# Patient Record
Sex: Male | Born: 2000 | Race: Black or African American | Hispanic: No | Marital: Single | Smoking: Never smoker
Health system: Southern US, Community
[De-identification: ages and names within clinical notes are randomized; demographics above are authoritative.]

## PROBLEM LIST (undated history)

## (undated) DIAGNOSIS — J45909 Unspecified asthma, uncomplicated: Secondary | ICD-10-CM

## (undated) HISTORY — DX: Unspecified asthma, uncomplicated: J45.909

---
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
## 2019-03-06 ENCOUNTER — Other Ambulatory Visit: Payer: Self-pay

## 2019-03-06 ENCOUNTER — Encounter (HOSPITAL_COMMUNITY): Payer: Self-pay

## 2019-03-06 ENCOUNTER — Ambulatory Visit (HOSPITAL_COMMUNITY)
Admission: EM | Admit: 2019-03-06 | Discharge: 2019-03-06 | Disposition: A | Discharge status: Home / Self Care | Payer: Medicaid Other | Attending: Family Medicine | Admitting: Family Medicine

## 2019-03-06 DIAGNOSIS — J020 Streptococcal pharyngitis: Secondary | ICD-10-CM

## 2019-03-06 LAB — POCT RAPID STREP A: Streptococcus, Group A Screen (Direct): POSITIVE — AB

## 2019-03-06 MED ORDER — AMOXICILLIN 875 MG PO TABS: 875.0000 mg | ORAL_TABLET | Freq: Two times a day (BID) | ORAL | 0 refills | Status: AC

## 2019-03-06 NOTE — ED Provider Notes (Signed)
[name]    CSN: [id number] Arrival date & time: 03/06/19  2542      History   Chief Complaint Chief Complaint  Patient presents with  . Sore Throat    HPI Miguel Byrd is a 18 y.o. male.   HPI  Patient's had a sore throat and fever for 2 days.  No known strep exposure.  No known COVID exposure.  He has a mild headache.  No nausea vomiting.  No abdominal pain.  No coughing or chest pain.  No shortness of breath.  Past Medical History:  Diagnosis Date  . Asthma     There are no active problems to display for this patient.   History reviewed. No pertinent surgical history.     Home Medications    Prior to Admission medications   Medication Sig Start Date End Date Taking? Authorizing Provider  amoxicillin (AMOXIL) 875 MG tablet Take 1 tablet (875 mg total) by mouth 2 (two) times daily for 10 days. 03/06/19 03/16/19  Raylene Everts, MD    Family History Family History  Problem Relation Age of Onset  . Asthma Other     Social History Social History   Tobacco Use  . Smoking status: Never Smoker  . Smokeless tobacco: Never Used  Substance Use Topics  . Alcohol use: No    Frequency: Never  . Drug use: No     Allergies   Patient has no known allergies.   Review of Systems Review of Systems  Constitutional: Positive for fever. Negative for chills.  HENT: Positive for sore throat and trouble swallowing. Negative for ear pain.   Eyes: Negative for pain and visual disturbance.  Respiratory: Negative for cough and shortness of breath.   Cardiovascular: Negative for chest pain and palpitations.  Gastrointestinal: Negative for abdominal pain and vomiting.  Genitourinary: Negative for dysuria and hematuria.  Musculoskeletal: Negative for arthralgias and back pain.  Skin: Negative for color change and rash.  Neurological: Positive for headaches. Negative for seizures and syncope.  All other systems reviewed and are negative.    Physical  Exam Triage Vital Signs ED Triage Vitals  Enc Vitals Group     BP 03/06/19 0823 117/82     Pulse Rate 03/06/19 0823 100     Resp 03/06/19 0823 17     Temp 03/06/19 0823 99.1 F (37.3 C)     Temp Source 03/06/19 0823 Oral     SpO2 03/06/19 0823 99 %     Weight --      Height --      Head Circumference --      Peak Flow --      Pain Score 03/06/19 0824 9     Pain Loc --      Pain Edu? --      Excl. in [place]? --    No data found.  Updated Vital Signs BP 117/82 (BP Location: Right Arm)   Pulse 100   Temp 99.1 F (37.3 C) (Oral)   Resp 17   SpO2 99%    Physical Exam Constitutional:      General: He is not in acute distress.    Appearance: He is well-developed and normal weight.  HENT:     Head: Normocephalic and atraumatic.     Ears:     Comments: Cerumen both ears    Nose: No congestion.     Mouth/Throat:     Mouth: Mucous membranes are moist.  Pharynx: Uvula midline. Posterior oropharyngeal erythema present.     Tonsils: No tonsillar exudate or tonsillar abscesses. 3+ on the right. 3+ on the left.  Eyes:     Conjunctiva/sclera: Conjunctivae normal.     Pupils: Pupils are equal, round, and reactive to light.  Neck:     Musculoskeletal: Normal range of motion.  Cardiovascular:     Rate and Rhythm: Normal rate.  Pulmonary:     Effort: Pulmonary effort is normal. No respiratory distress.  Abdominal:     General: There is no distension.     Palpations: Abdomen is soft.  Musculoskeletal: Normal range of motion.  Skin:    General: Skin is warm and dry.  Neurological:     General: No focal deficit present.     Mental Status: He is alert.  Psychiatric:        Mood and Affect: Mood normal.        Behavior: Behavior normal.      UC Treatments / Results  Labs (all labs ordered are listed, but only abnormal results are displayed) Labs Reviewed  POCT RAPID STREP A - Abnormal; Notable for the following components:      Result Value   Streptococcus, Group A  Screen (Direct) POSITIVE (*)    All other components within normal limits    EKG   Radiology No results found.  Procedures Procedures (including critical care time)  Medications Ordered in UC Medications - No data to display  Initial Impression / Assessment and Plan / UC Course  I have reviewed the triage vital signs and the nursing notes.  Pertinent labs & imaging results that were available during my care of the patient were reviewed by me and considered in my medical decision making (see chart for details).     Strep positive Final Clinical Impressions(s) / UC Diagnoses   Final diagnoses:  Strep throat     Discharge Instructions     It is important that you take 10 days of antibiotics Tylenol or ibuprofen for pain or fever May use salt water gargles or get chloraseptic spray for throat pain Call for problems   ED Prescriptions    Medication Sig Dispense Auth. Provider   amoxicillin (AMOXIL) 875 MG tablet Take 1 tablet (875 mg total) by mouth 2 (two) times daily for 10 days. 20 tablet Eustace MooreNelson, Quindarrius Joplin Sue, MD     Controlled Substance Prescriptions [place] Controlled Substance Registry consulted? no   Eustace MooreNelson, Melesio Madara Sue, MD 03/06/19 1731

## 2019-03-06 NOTE — Discharge Instructions (Signed)
It is important that you take 10 days of antibiotics Tylenol or ibuprofen for pain or fever May use salt water gargles or get chloraseptic spray for throat pain Call for problems

## 2019-03-06 NOTE — ED Triage Notes (Signed)
Pt presents with sore throat X 2 days. 

## 2020-12-01 ENCOUNTER — Other Ambulatory Visit: Payer: Self-pay

## 2020-12-01 ENCOUNTER — Ambulatory Visit (INDEPENDENT_AMBULATORY_CARE_PROVIDER_SITE_OTHER): Payer: Medicaid Other

## 2020-12-01 ENCOUNTER — Ambulatory Visit (HOSPITAL_COMMUNITY)
Admission: EM | Admit: 2020-12-01 | Discharge: 2020-12-01 | Disposition: A | Discharge status: Home / Self Care | Payer: Medicaid Other | Attending: Medical Oncology | Admitting: Medical Oncology

## 2020-12-01 ENCOUNTER — Encounter (HOSPITAL_COMMUNITY): Payer: Self-pay

## 2020-12-01 DIAGNOSIS — Z20822 Contact with and (suspected) exposure to covid-19: Secondary | ICD-10-CM | POA: Insufficient documentation

## 2020-12-01 DIAGNOSIS — R079 Chest pain, unspecified: Secondary | ICD-10-CM

## 2020-12-01 DIAGNOSIS — R069 Unspecified abnormalities of breathing: Secondary | ICD-10-CM

## 2020-12-01 DIAGNOSIS — R059 Cough, unspecified: Secondary | ICD-10-CM | POA: Diagnosis not present

## 2020-12-01 DIAGNOSIS — R0602 Shortness of breath: Secondary | ICD-10-CM

## 2020-12-01 DIAGNOSIS — K449 Diaphragmatic hernia without obstruction or gangrene: Secondary | ICD-10-CM

## 2020-12-01 DIAGNOSIS — J45909 Unspecified asthma, uncomplicated: Secondary | ICD-10-CM | POA: Diagnosis not present

## 2020-12-01 DIAGNOSIS — R071 Chest pain on breathing: Secondary | ICD-10-CM | POA: Insufficient documentation

## 2020-12-01 LAB — SARS CORONAVIRUS 2 (TAT 6-24 HRS): SARS Coronavirus 2: NEGATIVE

## 2020-12-01 MED ORDER — ALBUTEROL SULFATE HFA 108 (90 BASE) MCG/ACT IN AERS: 1.0000 | INHALATION_SPRAY | Freq: Four times a day (QID) | RESPIRATORY_TRACT | 0 refills | Status: AC | PRN

## 2020-12-01 MED ORDER — BENZONATATE 100 MG PO CAPS: 100.0000 mg | ORAL_CAPSULE | Freq: Three times a day (TID) | ORAL | 0 refills | Status: DC

## 2020-12-01 NOTE — ED Provider Notes (Signed)
[facility]    CSN: [id number] Arrival date & time: 12/01/20  2595      History   Chief Complaint SOB  HPI Miguel Byrd is a 20 y.o. male.   HPI   SOB: Pt with a history of asthma that is mild and very intermittent presents with a 1 day history of SOB and chest pain. As of this morning as cough has started. He reports that in the past he has had similar symptoms due to a hiatal hernia and asks for a chest x ray today. He denies worsening symptoms with food or activity. In terms of the SOB and chest pain he states that symptoms have greatly improved and have essentially resolved as of this morning. Chest pain described as sternal in nature and worse with deep inspiration. No accompanied nausea, dizziness, fever, syncope. He has not tried anything for symptoms.    Past Medical History:  Diagnosis Date  . Asthma     There are no problems to display for this patient.   History reviewed. No pertinent surgical history.     Home Medications    Prior to Admission medications   Medication Sig Start Date End Date Taking? Authorizing Provider  albuterol (VENTOLIN HFA) 108 (90 Base) MCG/ACT inhaler Inhale 1-2 puffs into the lungs every 6 (six) hours as needed for wheezing or shortness of breath. 12/01/20  Yes Rushie Chestnut, PA-C    Family History Family History  Problem Relation Age of Onset  . Asthma Other     Social History Social History   Tobacco Use  . Smoking status: Never Smoker  . Smokeless tobacco: Never Used  Vaping Use  . Vaping Use: Never used  Substance Use Topics  . Alcohol use: No  . Drug use: No     Allergies   Patient has no known allergies.   Review of Systems Review of Systems  As stated above in HPI Physical Exam Triage Vital Signs ED Triage Vitals  Enc Vitals Group     BP 12/01/20 0834 117/74     Pulse Rate 12/01/20 0834 76     Resp 12/01/20 0834 17     Temp 12/01/20 0834 99.2 F (37.3 C)     Temp Source 12/01/20  0834 Oral     SpO2 12/01/20 0834 99 %     Weight --      Height --      Head Circumference --      Peak Flow --      Pain Score 12/01/20 0830 8     Pain Loc --      Pain Edu? --      Excl. in [place]? --    No data found.  Updated Vital Signs BP 117/74 (BP Location: Left Arm)   Pulse 76   Temp 99.2 F (37.3 C) (Oral)   Resp 17   SpO2 99%   Physical Exam Vitals and nursing note reviewed.  Constitutional:      General: He is not in acute distress.    Appearance: Normal appearance. He is not ill-appearing, toxic-appearing or diaphoretic.  HENT:     Head: Normocephalic and atraumatic.     Right Ear: Tympanic membrane, ear canal and external ear normal.     Left Ear: Tympanic membrane, ear canal and external ear normal.     Nose: Nose normal.     Mouth/Throat:     Mouth: Mucous membranes are moist.     Pharynx: Oropharynx  is clear. No oropharyngeal exudate or posterior oropharyngeal erythema.  Eyes:     Extraocular Movements: Extraocular movements intact.     Pupils: Pupils are equal, round, and reactive to light.     Comments: No pallor   Cardiovascular:     Rate and Rhythm: Normal rate and regular rhythm.     Pulses: Normal pulses.     Heart sounds: Normal heart sounds.  Pulmonary:     Effort: Pulmonary effort is normal. No respiratory distress.     Breath sounds: Normal breath sounds. No stridor. No wheezing, rhonchi or rales.  Chest:     Chest wall: No tenderness.  Musculoskeletal:     Cervical back: Normal range of motion and neck supple.  Lymphadenopathy:     Cervical: No cervical adenopathy.  Skin:    General: Skin is warm.     Capillary Refill: Capillary refill takes less than 2 seconds.     Coloration: Skin is not pale.  Neurological:     Mental Status: He is alert and oriented to person, place, and time.      UC Treatments / Results  Labs (all labs ordered are listed, but only abnormal results are displayed) Labs Reviewed  SARS CORONAVIRUS 2 (TAT 6-24  HRS)    EKG   Radiology No results found.  Procedures Procedures (including critical care time)  Medications Ordered in UC Medications - No data to display  Initial Impression / Assessment and Plan / UC Course  I have reviewed the triage vital signs and the nursing notes.  Pertinent labs & imaging results that were available during my care of the patient were reviewed by me and considered in my medical decision making (see chart for details).     New. Vitals reassuring. No active chest pain or SOB to warrant EKG at this time. Will obtain a chest x ray given his 99.46F temp and history of hiatal hernia. Given his history will send in albuterol. Discussed negative x rays   Final Clinical Impressions(s) / UC Diagnoses   Final diagnoses:  SOB (shortness of breath)  Cough   Discharge Instructions   None    ED Prescriptions    Medication Sig Dispense Auth. Provider   albuterol (VENTOLIN HFA) 108 (90 Base) MCG/ACT inhaler Inhale 1-2 puffs into the lungs every 6 (six) hours as needed for wheezing or shortness of breath. 18 g Calah Gershman M, [place]     PDMP not reviewed this encounter.   Rushie Chestnut, [place] 12/01/20 [phone]

## 2020-12-01 NOTE — ED Notes (Signed)
Pt declined COVID testing.

## 2020-12-01 NOTE — ED Triage Notes (Signed)
Pt c/o trouble breathing and chest pain X last night. Pt states he started coughing this morning.

## 2021-08-11 ENCOUNTER — Other Ambulatory Visit: Payer: Self-pay

## 2021-08-11 ENCOUNTER — Encounter (HOSPITAL_COMMUNITY): Payer: Self-pay | Admitting: Emergency Medicine

## 2021-08-11 ENCOUNTER — Emergency Department (HOSPITAL_COMMUNITY)
Admission: EM | Admit: 2021-08-11 | Discharge: 2021-08-12 | Disposition: A | Discharge status: Home / Self Care | Payer: Medicaid Other | Attending: Emergency Medicine | Admitting: Emergency Medicine

## 2021-08-11 DIAGNOSIS — W268XXA Contact with other sharp object(s), not elsewhere classified, initial encounter: Secondary | ICD-10-CM | POA: Diagnosis not present

## 2021-08-11 DIAGNOSIS — Z23 Encounter for immunization: Secondary | ICD-10-CM | POA: Diagnosis not present

## 2021-08-11 DIAGNOSIS — Z046 Encounter for general psychiatric examination, requested by authority: Secondary | ICD-10-CM | POA: Diagnosis present

## 2021-08-11 DIAGNOSIS — R45851 Suicidal ideations: Secondary | ICD-10-CM | POA: Insufficient documentation

## 2021-08-11 DIAGNOSIS — F4329 Adjustment disorder with other symptoms: Secondary | ICD-10-CM | POA: Diagnosis not present

## 2021-08-11 DIAGNOSIS — J45909 Unspecified asthma, uncomplicated: Secondary | ICD-10-CM | POA: Insufficient documentation

## 2021-08-11 DIAGNOSIS — S61511A Laceration without foreign body of right wrist, initial encounter: Secondary | ICD-10-CM | POA: Diagnosis not present

## 2021-08-11 DIAGNOSIS — Z20822 Contact with and (suspected) exposure to covid-19: Secondary | ICD-10-CM | POA: Insufficient documentation

## 2021-08-11 LAB — SALICYLATE LEVEL: Salicylate Lvl: <7 mg/dL — ABNORMAL LOW (ref 7.0–30.0)

## 2021-08-11 LAB — CBC WITH DIFFERENTIAL/PLATELET
Abs Immature Granulocytes: 0.01 10*3/uL (ref 0.00–0.07)
Basophils Absolute: 0 10*3/uL (ref 0.0–0.1)
Basophils Relative: 1 %
Eosinophils Absolute: 0.1 10*3/uL (ref 0.0–0.5)
Eosinophils Relative: 1 %
HCT: 46.8 % (ref 39.0–52.0)
Hemoglobin: 15.9 g/dL (ref 13.0–17.0)
Immature Granulocytes: 0 %
Lymphocytes Relative: 39 %
Lymphs Abs: 2.4 10*3/uL (ref 0.7–4.0)
MCH: 31.7 pg (ref 26.0–34.0)
MCHC: 34 g/dL (ref 30.0–36.0)
MCV: 93.2 fL (ref 80.0–100.0)
Monocytes Absolute: 0.5 10*3/uL (ref 0.1–1.0)
Monocytes Relative: 8 %
Neutro Abs: 3.3 10*3/uL (ref 1.7–7.7)
Neutrophils Relative %: 51 %
Platelets: 209 10*3/uL (ref 150–400)
RBC: 5.02 MIL/uL (ref 4.22–5.81)
RDW: 12.6 % (ref 11.5–15.5)
WBC: 6.3 10*3/uL (ref 4.0–10.5)
nRBC: 0 % (ref 0.0–0.2)

## 2021-08-11 LAB — RESP PANEL BY RT-PCR (FLU A&B, COVID) ARPGX2
Influenza A by PCR: NEGATIVE
Influenza B by PCR: NEGATIVE
SARS Coronavirus 2 by RT PCR: NEGATIVE

## 2021-08-11 LAB — COMPREHENSIVE METABOLIC PANEL
ALT: 10 U/L (ref 0–44)
AST: 27 U/L (ref 15–41)
Albumin: 4.6 g/dL (ref 3.5–5.0)
Alkaline Phosphatase: 48 U/L (ref 38–126)
Anion gap: 7 (ref 5–15)
BUN: 11 mg/dL (ref 6–20)
CO2: 27 mmol/L (ref 22–32)
Calcium: 9.4 mg/dL (ref 8.9–10.3)
Chloride: 106 mmol/L (ref 98–111)
Creatinine, Ser: 1.23 mg/dL (ref 0.61–1.24)
GFR, Estimated: >60 mL/min (ref >60)
Glucose, Bld: 88 mg/dL (ref 70–99)
Potassium: 4 mmol/L (ref 3.5–5.1)
Sodium: 140 mmol/L (ref 135–145)
Total Bilirubin: 1.6 mg/dL — ABNORMAL HIGH (ref 0.3–1.2)
Total Protein: 7.4 g/dL (ref 6.5–8.1)

## 2021-08-11 LAB — ACETAMINOPHEN LEVEL: Acetaminophen (Tylenol), Serum: <10 ug/mL — ABNORMAL LOW (ref 10–30)

## 2021-08-11 LAB — ETHANOL: Alcohol, Ethyl (B): <10 mg/dL (ref <10)

## 2021-08-11 MED ORDER — TETANUS-DIPHTH-ACELL PERTUSSIS 5-2.5-18.5 LF-MCG/0.5 IM SUSY: 0.5000 mL | PREFILLED_SYRINGE | Freq: Once | INTRAMUSCULAR | Status: DC

## 2021-08-11 MED ORDER — ALBUTEROL SULFATE HFA 108 (90 BASE) MCG/ACT IN AERS: 1.0000 | INHALATION_SPRAY | Freq: Four times a day (QID) | RESPIRATORY_TRACT | Status: DC | PRN

## 2021-08-11 NOTE — ED Provider Notes (Signed)
Emergency Medicine Provider Triage Evaluation Note  Messi Twedt , a 20 y.o. male  was evaluated in triage.  Pt complains of suicidal ideation with suicidal attempt today.  Reports he has had suicidal ideation x2 months.  Denies homicidal ideation, audiovisual hallucinations.  Review of Systems  Positive: Suicidal ideation Negative: HI, AVH, chest pain, fever, shortness of breath  Physical Exam  BP 122/83 (BP Location: Left Arm)   Pulse (!) 102   Temp 98.3 F (36.8 C) (Oral)   Resp 18   SpO2 100%  Gen:   Awake, no distress   Resp:  Normal effort  MSK:   Moves extremities without difficulty  Other:    Medical Decision Making  Medically screening exam initiated at 10:36 PM.  Appropriate orders placed.  Jacorie Ernsberger was informed that the remainder of the evaluation will be completed by another provider, this initial triage assessment does not replace that evaluation, and the importance of remaining in the ED until their evaluation is complete.     Marita [place], PA-C 08/11/21 2236    Benjiman Core, MD 08/11/21 2251

## 2021-08-11 NOTE — BH Assessment (Signed)
Clinician made contact with pt's team at 2354 in regards to completing pt's MH Assessment. Clinician did not receive a response regarding the machine being moved by 0053 so she requested the team let her know at a later time if pt is ready to be seen.

## 2021-08-11 NOTE — ED Triage Notes (Signed)
Pt states that he tried to kill himself by cutting his right wrist. Pt reports being stressed out x 2 months. There are 2 small marks to pt's right wrist.
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
## 2021-08-12 DIAGNOSIS — F4329 Adjustment disorder with other symptoms: Secondary | ICD-10-CM

## 2021-08-12 NOTE — BH Assessment (Addendum)
Comprehensive Clinical Assessment (CCA) Note  08/12/2021 Miguel Byrd [id number] Disposition: Clinician discussed patient care with Lindon Romp, FNP.  He recommended patient be observed overnight in the ED.  There are no beds available at [facility] at this time for observation.  Pt to be seen by psychiatry on 12/09 during the day.  Clinician informed Abigail Butts, PA of disposition via secure messaging.  [place] ED from 08/11/2021 in [facility] ED from 12/01/2020 in [facility] [place] High Risk No Risk      The patient demonstrates the following risk factors for suicide: Chronic risk factors for suicide include: psychiatric disorder of MDD recurrent, severe and substance use disorder. Acute risk factors for suicide include: unemployment and social withdrawal/isolation. Protective factors for this patient include: positive social support. Considering these factors, the overall suicide risk at this point appears to be high. Patient is not appropriate for outpatient follow up.  Pt is oriented and has fair eye contact.  Pt is not responding to internal stimuli.  He does not evidence any delusional thought content.  He is clear and coherent with expressing himself.  Pt reports a poor appetite and only getting about 4-5 hours of sleep a night.    Pt has no current outpatient services.  Chief Complaint:  Chief Complaint  Patient presents with   Suicidal   Visit Diagnosis: MDD recurrent, severe; Cannabis use d/o severe    CCA Screening, Triage and Referral (STR)  Patient Reported Information How did you hear about [place]? Family/Friend (Mother brought him to [facility].)  What Is the Reason for Your Visit/Call Today? Pt lives with his cousin.  Yesterday (12/08) he went to his grandmotehr's home and called his mother and told her he was having some suicidal thoughts for the last two months.  Pt mother brought him to  hospital.  Pt says he feels very overwhelmed.  "I try to reach out for help but it is difficult for me to open up."  He says his stressor is "not knowing what to do with my life."  Patient has been having plan to cut his wrists since two days ago.  Pt did make some cuts to his right wrist with a pocket knife on Wednesday (12/07).  Pt has had no previous attempt to kill himself.  Patient denies any HI or A/V hallucinations.  Pt admits to using marijuana from a bong daily, usually about a gram.  Pt has been using daily since November '21.  Pt denies any access to weapons.  Pt sleeps about 4-5 hours a night.  Reports a poor appetite.  Will use the marijuana to boost his appetite.  Pt not receiving any services now.  Pt does not feel safe by himself.  How Long Has This Been Causing You Problems? 1-6 months  What Do You Feel Would Help You the Most Today? Treatment for Depression or other mood problem   Have You Recently Had Any Thoughts About Hurting Yourself? Yes  Are You Planning to Commit Suicide/Harm Yourself At This time? Yes   Have you Recently Had Thoughts About Hurting Someone [name]? No  Are You Planning to Harm Someone at This Time? No  Explanation: No data recorded  Have You Used Any Alcohol or Drugs in the Past 24 Hours? Yes  How Long Ago Did You Use Drugs or Alcohol? No data recorded What Did You Use and How Much? About less than a gram of  marijuana yesteray 912/08).   Do You Currently Have a Therapist/Psychiatrist? No  Name of Therapist/Psychiatrist: No data recorded  Have You Been Recently Discharged From Any Office Practice or Programs? No  Explanation of Discharge From Practice/Program: No data recorded    CCA Screening Triage Referral Assessment Type of Contact: Tele-Assessment  Telemedicine Service Delivery:   Is this Initial or Reassessment? Initial Assessment  Date Telepsych consult ordered in [facility]:  08/11/21  Time Telepsych consult ordered in [facility]:   2316  Location of Assessment: [facility] ED  Provider Location: [facility] Assessment Services   Collateral Involvement: No data recorded  Does Patient Have a [place]? No data recorded Name and Contact of Legal Guardian: No data recorded If Minor and Not Living with Parent(s), Who has Custody? No data recorded Is CPS involved or ever been involved? Never  Is APS involved or ever been involved? Never   Patient Determined To Be At Risk for Harm To Self or Others Based on Review of Patient Reported Information or Presenting Complaint? Yes, for Self-Harm  Method: No data recorded Availability of Means: No data recorded Intent: No data recorded Notification Required: No data recorded Additional Information for Danger to Others Potential: No data recorded Additional Comments for Danger to Others Potential: No data recorded Are There Guns or Other Weapons in Your Home? No data recorded Types of Guns/Weapons: No data recorded Are These Weapons Safely Secured?                            No data recorded Who Could Verify You Are Able To Have These Secured: No data recorded Do You Have any Outstanding Charges, Pending Court Dates, Parole/Probation? No data recorded Contacted To Inform of Risk of Harm To Self or Others: No data recorded   Does Patient Present under Involuntary Commitment? No  IVC Papers Initial File Date: No data recorded  [place] of Residence: [place]   Patient Currently Receiving the Following Services: Not Receiving Services   Determination of Need: Urgent (48 hours)   Options For Referral: Other: Comment (Pt to be observed overnight in ED.  No bed space available at [facility].)     [place] Patient Reported Schizophrenia/Schizoaffective Diagnosis in Past: No   Strengths: Pt likes to sing and make music   Mental Health Symptoms Depression:   Difficulty Concentrating; Sleep (too much or little); Increase/decrease in appetite;  Hopelessness; Worthlessness; Fatigue   Duration of Depressive symptoms:  Duration of Depressive Symptoms: Greater than two weeks   Mania:   None   Anxiety:    Difficulty concentrating; Tension; Worrying; Irritability   Psychosis:   None   Duration of Psychotic symptoms:    Trauma:   None   Obsessions:   Cause anxiety   Compulsions:   Disrupts with routine/functioning   Inattention:   None   Hyperactivity/Impulsivity:   None   Oppositional/Defiant Behaviors:   None   Emotional Irregularity:   None   Other Mood/Personality Symptoms:  No data recorded   Mental Status Exam Appearance and self-care  Stature:   Average   Weight:   Thin   Clothing:   Casual   Grooming:   Normal   Cosmetic use:   None   Posture/gait:   Normal   Motor activity:   Not Remarkable   Sensorium  Attention:   Normal   Concentration:   Anxiety interferes   Orientation:   X5  Recall/memory:   Normal   Affect and Mood  Affect:   Anxious; Depressed   Mood:   Anxious; Depressed   Relating  Eye contact:   Normal   Facial expression:   Depressed; Anxious   Attitude toward examiner:   Cooperative   Thought and Language  Speech flow:  Clear and Coherent   Thought content:   Appropriate to Mood and Circumstances   Preoccupation:   None   Hallucinations:   None   Organization:  No data recorded  [employer] of Knowledge:   Average   Intelligence:   Average   Abstraction:   Normal   Judgement:   Poor   Reality Testing:   Realistic   Insight:   Fair   Decision Making:   Impulsive   Social Functioning  Social Maturity:   Impulsive   Social Judgement:   Normal   Stress  Stressors:   Relationship   Coping Ability:   Normal   Skill Deficits:   Interpersonal   Supports:   Family; Support needed     Religion:    Leisure/Recreation:    Exercise/Diet: Exercise/Diet Have You Gained or Lost A  Significant Amount of Weight in the Past Six Months?: No Do You Have Any Trouble Sleeping?: Yes Explanation of Sleeping Difficulties: Getting 4-5 hours.   CCA Employment/Education Employment/Work Situation: Employment / Work Situation Employment Situation: Unemployed Patient's Job has Been Impacted by Current Illness: No Has Patient ever Been in Passenger transport manager?: No  Education: Education Is Patient Currently Attending School?: No Last Grade Completed: 12 Did You Nutritional therapist?: Yes What Type of College Degree Do you Have?: Attended [employer] for a semester   CCA Family/Childhood History Family and Relationship History: Family history Marital status: Single Does patient have children?: No  Childhood History:  Childhood History By whom was/is the patient raised?: Mother Did patient suffer any verbal/emotional/physical/sexual abuse as a child?: No Did patient suffer from severe childhood neglect?: No Has patient ever been sexually abused/assaulted/raped as an adolescent or adult?: No Witnessed domestic violence?: No Has patient been affected by domestic violence as an adult?: No  Child/Adolescent Assessment:     CCA Substance Use Alcohol/Drug Use: Alcohol / Drug Use Pain Medications: None Prescriptions: None Over the Counter: None History of alcohol / drug use?: Yes Substance #1 Name of Substance 1: Marijuana 1 - Age of First Use: 20 years of age 10 - Amount (size/oz): About a gram 1 - Frequency: Daily 1 - Duration: ongoing for the last year 1 - Last Use / Amount: 12/98 about a gram 1 - Method of Aquiring: Smoking from a bong.                       ASAM's:  Six Dimensions of Multidimensional Assessment  Dimension 1:  Acute Intoxication and/or Withdrawal Potential:      Dimension 2:  Biomedical Conditions and Complications:      Dimension 3:  Emotional, Behavioral, or Cognitive Conditions and Complications:     Dimension 4:  Readiness to Change:      Dimension 5:  Relapse, Continued use, or Continued Problem Potential:     Dimension 6:  Recovery/Living Environment:     ASAM Severity Score:    ASAM Recommended Level of Treatment:     Substance use Disorder (SUD)    Recommendations for Services/Supports/Treatments:    Discharge Disposition:    DSM5 Diagnoses: There are no problems to display for this  patient.    Referrals to Alternative Service(s): Referred to Alternative Service(s):   Place:   Date:   Time:    Referred to Alternative Service(s):   Place:   Date:   Time:    Referred to Alternative Service(s):   Place:   Date:   Time:    Referred to Alternative Service(s):   Place:   Date:   Time:     Wandra Mannan

## 2021-08-12 NOTE — ED Provider Notes (Signed)
Patient has been cleared by TTS.  Plan is in place for follow-up.  Patient's mother is at bedside.  She reports she is comfortable with the follow-up plan and will be with the patient.   Arby Barrette, MD 08/12/21 1500

## 2021-08-12 NOTE — BH Assessment (Signed)
[facility] Assessment Progress Note   Per Maxie Barb, NP , this voluntary pt does not require psychiatric hospitalization at this time.  Pt is psychiatrically cleared.  Discharge instructions include referral information for [facility].  EDP Arby Barrette, MD and pt's nurse, Lamar Laundry, have been notified.  Doylene Canning, MA Triage Specialist [phone]

## 2021-08-12 NOTE — Consult Note (Signed)
Telepsych Consultation   Reason for Consult:  psych consult Referring Physician:  Dahlia Client Muthersbaugh PA-C Location of Patient:  [facility] Location of Provider: Behavioral Health TTS Department  Patient Identification: Rockne Dearinger MRN:  [id number] Principal Diagnosis: Adjustment disorder with emotional disturbance Diagnosis:  Principal Problem:   Adjustment disorder with emotional disturbance   Total Time spent with patient: 30 minutes  Subjective:   Khamron Gellert is a 20 y.o. male patient admitted with suicidal thoughts.  Patient presents alert and oriented. Calm and cooperative. States he was admitted for "Suicidal attempt. Cut my wrist. Just feeling like I'm running out of time with life. I don't really know what I want out of it or what's making me happy".   Pt states he currently lives with his cousin. Dad is in [place], mom lives in [place]. States relationship with parents is "good". High school graduate [place], [place]). 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 Notes working from 0600-1700/7 days of week; felt like the job was "draining" him mentally. States he feels like he doesn't know what he wants to do with his life but knows he can't work in a place like that. No psych history or current outpatient psychiatric services; says he doesn't talk to people about his problems but is willing to go to therapy.     He endorses poor sleep (4-5 hours; usually 2-3 hours), current friends and hobbies, current feelings of guilt and worthlessness, energy and concentration "all over the place", poor appetite, passive suicidal thoughts x2 months. Lost passion to do music x2 months; unknown reason or trigger. Says he feels like he is "falling behind" in life due to not being in school or "where" he wants to be.   Feels "more relaxed" today. Smoking weed daily,  "to eat". Denies any access to weapons. Denies any active suicidal ideations; endorses recently feeling "overwhelmed". He denies any homicidal ideations, auditory or visual hallucinations, and does not appear to be responding to any external/internal stimuli.   Collateral: Sheralyn Boatman [phone] "I do feel like he had some stuff going on; I would try to talk to him but he holds a lot of stuff in. He doesn't really know how to express him. I don't know what happened to him to trigger him. Sometimes I can say he can have a little temper. Increased mood swings; doesn't want to be around other people, less interactive. He told me before moving in with his cousin that it's a process and everybody is trying to figure things out at your age. He was dating a few girls. He was working at [employer] for a while, he got fired due to his temper; he was working at [employer] for a while then he ended up going to the other job. He's been looking for work but they haven't been working. I don't know what triggered this". Mom denies any immediate safety concerns at this time; contracts for safety and states plan to assist pt with follow up with [facility] for outpatient services.     HPI:  Dionel Archey is a 20 year old male with no documented psychiatric history who presented to [facility] with chief complaint of suicidal ideation via slitting his wrist. Patient endorses increased feelings of depression, helplessness, and being overwhelmed. UDS outstanding; BAL<10.   Past Psychiatric History: none  Risk to Self:  pt denies Risk to Others:  pt denies Prior Inpatient Therapy:  pt denies Prior Outpatient Therapy:  pt denies  Past Medical History:  Past Medical History:  Diagnosis Date   Asthma    History reviewed. No pertinent surgical history. Family History:  Family History  Problem Relation Age of Onset   Asthma Other    Family Psychiatric  History: not noted Social History:  Social History   Substance and Sexual  Activity  Alcohol Use No     Social History   Substance and Sexual Activity  Drug Use No    Social History   Socioeconomic History   Marital status: Single    Spouse name: Not on file   Number of children: Not on file   Years of education: Not on file   Highest education level: Not on file  Occupational History   Not on file  Tobacco Use   Smoking status: Never   Smokeless tobacco: Never  Vaping Use   Vaping Use: Never used  Substance and Sexual Activity   Alcohol use: No   Drug use: No   Sexual activity: Never  Other Topics Concern   Not on file  Social History Narrative   Not on file   Social Determinants of Health   Financial Resource Strain: Not on file  Food Insecurity: Not on file  Transportation Needs: Not on file  Physical Activity: Not on file  Stress: Not on file  Social Connections: Not on file   Additional Social History:    Allergies:  No Known Allergies  Labs:  Results for orders placed or performed during the hospital encounter of 08/11/21 (from the past 48 hour(s))  Resp Panel by RT-PCR (Flu A&B, Covid) Nasopharyngeal Swab     Status: None   Collection Time: 08/11/21 10:17 PM   Specimen: Nasopharyngeal Swab; Nasopharyngeal(NP) swabs in vial transport medium  Result Value Ref Range   SARS Coronavirus 2 by RT PCR NEGATIVE NEGATIVE    Comment: (NOTE) SARS-CoV-2 target nucleic acids are NOT DETECTED.  The SARS-CoV-2 RNA is generally detectable in upper respiratory specimens during the acute phase of infection. The lowest concentration of SARS-CoV-2 viral copies this assay can detect is 138 copies/mL. A negative result does not preclude SARS-Cov-2 infection and should not be used as the sole basis for treatment or other patient management decisions. A negative result may occur with  improper specimen collection/handling, submission of specimen other than nasopharyngeal swab, presence of viral mutation(s) within the areas targeted by this  assay, and inadequate number of viral copies(<138 copies/mL). A negative result must be combined with clinical observations, patient history, and epidemiological information. The expected result is Negative.  Fact Sheet for Patients:  BloggerCourse.com  Fact Sheet for Healthcare Providers:  SeriousBroker.it  This test is no t yet approved or cleared by the [place] FDA and  has been authorized for detection and/or diagnosis of SARS-CoV-2 by FDA under an Emergency Use Authorization (EUA). This EUA will remain  in effect (meaning this test can be used) for the duration of the COVID-19 declaration under Section 564(b)(1) of the Act, 21 U.S.C.section 360bbb-3(b)(1), unless the authorization is terminated  or revoked sooner.       Influenza A by PCR NEGATIVE NEGATIVE   Influenza B by PCR NEGATIVE NEGATIVE    Comment: (NOTE) The Xpert Xpress SARS-CoV-2/FLU/RSV plus assay is intended as an aid in the diagnosis of influenza from Nasopharyngeal swab specimens and should not be used as a sole basis for treatment. Nasal washings and  aspirates are unacceptable for Xpert Xpress SARS-CoV-2/FLU/RSV testing.  Fact Sheet for Patients: BloggerCourse.com  Fact Sheet for Healthcare Providers: SeriousBroker.it  This test is not yet approved or cleared by the [place] FDA and has been authorized for detection and/or diagnosis of SARS-CoV-2 by FDA under an Emergency Use Authorization (EUA). This EUA will remain in effect (meaning this test can be used) for the duration of the COVID-19 declaration under Section 564(b)(1) of the Act, 21 U.S.C. section 360bbb-3(b)(1), unless the authorization is terminated or revoked.  Performed at [facility], [address] [place], [place] [address]   Comprehensive metabolic panel     Status: Abnormal   Collection Time: 08/11/21  10:22 PM  Result Value Ref Range   Sodium 140 135 - 145 mmol/L   Potassium 4.0 3.5 - 5.1 mmol/L   Chloride 106 98 - 111 mmol/L   CO2 27 22 - 32 mmol/L   Glucose, Bld 88 70 - 99 mg/dL    Comment: Glucose reference range applies only to samples taken after fasting for at least 8 hours.   BUN 11 6 - 20 mg/dL   Creatinine, Ser 9.32 0.61 - 1.24 mg/dL   Calcium 9.4 8.9 - 67.1 mg/dL   Total Protein 7.4 6.5 - 8.1 g/dL   Albumin 4.6 3.5 - 5.0 g/dL   AST 27 15 - 41 U/L   ALT 10 0 - 44 U/L   Alkaline Phosphatase 48 38 - 126 U/L   Total Bilirubin 1.6 (H) 0.3 - 1.2 mg/dL   GFR, Estimated >24 >58 mL/min    Comment: (NOTE) Calculated using the CKD-EPI Creatinine Equation (2021)    Anion gap 7 5 - 15    Comment: Performed at [facility], [address], [place], [place] [address]  Ethanol     Status: None   Collection Time: 08/11/21 10:22 PM  Result Value Ref Range   Alcohol, Ethyl (B) <10 <10 mg/dL    Comment: (NOTE) Lowest detectable limit for serum alcohol is 10 mg/dL.  For medical purposes only. Performed at [facility], [address] [place], [place] [address]   CBC with Diff     Status: None   Collection Time: 08/11/21 10:22 PM  Result Value Ref Range   WBC 6.3 4.0 - 10.5 K/uL   RBC 5.02 4.22 - 5.81 MIL/uL   Hemoglobin 15.9 13.0 - 17.0 g/dL   HCT 53.9 76.7 - 34.1 %   MCV 93.2 80.0 - 100.0 fL   MCH 31.7 26.0 - 34.0 pg   MCHC 34.0 30.0 - 36.0 g/dL   RDW 93.7 90.2 - 40.9 %   Platelets 209 150 - 400 K/uL   nRBC 0.0 0.0 - 0.2 %   Neutrophils Relative % 51 %   Neutro Abs 3.3 1.7 - 7.7 K/uL   Lymphocytes Relative 39 %   Lymphs Abs 2.4 0.7 - 4.0 K/uL   Monocytes Relative 8 %   Monocytes Absolute 0.5 0.1 - 1.0 K/uL   Eosinophils Relative 1 %   Eosinophils Absolute 0.1 0.0 - 0.5 K/uL   Basophils Relative 1 %   Basophils Absolute 0.0 0.0 - 0.1 K/uL   Immature Granulocytes 0 %   Abs Immature Granulocytes 0.01 0.00 - 0.07 K/uL    Comment:  Performed at [facility], [address], [place], [place] [address]  Acetaminophen level     Status: Abnormal   Collection Time: 08/11/21 10:22 PM  Result  Value Ref Range   Acetaminophen (Tylenol), Serum <10 (L) 10 - 30 ug/mL    Comment: (NOTE) Therapeutic concentrations vary significantly. A range of 10-30 ug/mL  may be an effective concentration for many patients. However, some  are best treated at concentrations outside of this range. Acetaminophen concentrations >150 ug/mL at 4 hours after ingestion  and >50 ug/mL at 12 hours after ingestion are often associated with  toxic reactions.  Performed at [facility], [address] [place], [place] [address]   Salicylate level     Status: Abnormal   Collection Time: 08/11/21 10:22 PM  Result Value Ref Range   Salicylate Lvl <7.0 (L) 7.0 - 30.0 mg/dL    Comment: Performed at [facility], [address], [place], [place] [address]    Medications:  Current Facility-Administered Medications  Medication Dose Route Frequency Provider Last Rate Last Admin   albuterol (VENTOLIN HFA) 108 (90 Base) MCG/ACT inhaler 1-2 puff  1-2 puff Inhalation Q6H PRN Muthersbaugh, Dahlia Client, PA-C       Tdap (BOOSTRIX) injection 0.5 mL  0.5 mL Intramuscular Once Muthersbaugh, Dahlia Client, PA-C       Current Outpatient Medications  Medication Sig Dispense Refill   albuterol (VENTOLIN HFA) 108 (90 Base) MCG/ACT inhaler Inhale 1-2 puffs into the lungs every 6 (six) hours as needed for wheezing or shortness of breath. (Patient not taking: Reported on 08/12/2021) 18 g 0    Musculoskeletal: Strength & Muscle Tone: within normal limits Gait & Station: normal Patient leans: N/A  Psychiatric Specialty Exam:  Presentation  General Appearance: Casual Eye Contact:Fair Speech:Clear and Coherent Speech Volume:Normal Handedness:No data recorded  Mood and Affect  Mood:Euthymic Affect:Depressed  Thought  Process  Thought Processes:Coherent Descriptions of Associations:Intact Orientation:Full (Time, Place and Person) Thought Content:Logical History of Schizophrenia/Schizoaffective disorder:No  Duration of Psychotic Symptoms:No data recorded Hallucinations:Hallucinations: None Ideas of Reference:None Suicidal Thoughts:Suicidal Thoughts: No Homicidal Thoughts:Homicidal Thoughts: No  Sensorium  Memory:Immediate Fair; Recent Fair; Remote Fair Judgment:Fair Insight:Fair  Executive Functions  Concentration:Fair Attention Span:Fair Recall:Fair Fund of Knowledge:Fair Language:Fair  Psychomotor Activity  Psychomotor Activity:Psychomotor Activity: Normal  Assets  Assets:Communication Skills; Desire for Improvement; Housing; Health and safety inspector; Physical Health; Resilience; Social Support; Vocational/Educational  Sleep  Sleep:Sleep: Good   Physical Exam: Physical Exam Vitals and nursing note reviewed.  Constitutional:      Appearance: He is normal weight. He is not ill-appearing or toxic-appearing.  HENT:     Head: Normocephalic.     Nose: Nose normal.     Mouth/Throat:     Mouth: Mucous membranes are moist.     Pharynx: Oropharynx is clear.  Eyes:     Pupils: Pupils are equal, round, and reactive to light.  Cardiovascular:     Rate and Rhythm: Normal rate.     Pulses: Normal pulses.  Pulmonary:     Effort: Pulmonary effort is normal.  Musculoskeletal:        General: Normal range of motion.     Cervical back: Normal range of motion.  Skin:    General: Skin is warm and dry.  Neurological:     Mental Status: He is alert and oriented to person, place, and time.  Psychiatric:        Attention and Perception: Attention and perception normal.        Mood and Affect: Mood is depressed.        Speech: Speech normal.        Behavior: Behavior is withdrawn. Behavior is  cooperative.        Thought Content: Thought content is not paranoid or delusional. Thought  content does not include homicidal or suicidal ideation. Thought content does not include homicidal or suicidal plan.        Cognition and Memory: Cognition and memory normal.   Review of Systems  Psychiatric/Behavioral:  Positive for depression. Negative for hallucinations, substance abuse and suicidal ideas.   All other systems reviewed and are negative. Blood pressure 121/80, pulse 83, temperature 98.3 F (36.8 C), temperature source Oral, resp. rate 16, SpO2 100 %. There is no height or weight on file to calculate BMI.  Treatment Plan Summary: Plan Discharge home with plan to follow up with outpatient supports for therapy.   Disposition: No evidence of imminent risk to self or others at present.   Patient does not meet criteria for psychiatric inpatient admission. Supportive therapy provided about ongoing stressors. Discussed crisis plan, support from social network, calling 911, coming to the Emergency Department, and calling Suicide Hotline. [facility] resources provided.   This service was provided via telemedicine using a 2-way, interactive audio and video technology.  Names of all persons participating in this telemedicine service and their role in this encounter. Name: Maxie Barb Role: PMHNP  Name: Nelly Rout Role: Attending MD  Name: Chrystie Nose Role: patient   Name: Sheralyn Boatman Role: mother    Loletta Parish, NP 08/12/2021 1:08 PM

## 2021-08-12 NOTE — ED Notes (Signed)
Belongings placed in a belonging's bag x 2 and outside drink removed from the room. Patient was instructed that his door had to remain open. Patient verbalized understanding.

## 2021-08-12 NOTE — Discharge Instructions (Addendum)
For your behavioral health needs you are advised to follow up with [facility] at your earliest opportunity: °     [facility] °     [address] °     [place], [place] [address] °     [phone] °     They offer psychiatry/medication management and therapy.  New patients are seen in their walk-in clinic.  Walk-in hours are Monday - Thursday from 8:00 am - 11:00 am for psychiatry, and Friday from 1:00 pm - 4:00 pm for therapy.  Walk-in patients are seen on a first come, first served basis, so try to arrive as early as possible for the best chance of being seen the same day.  Please note that to be eligible for services you must bring an ID or a piece of mail with your name and a [place] address. ° °

## 2021-08-16 ENCOUNTER — Encounter (HOSPITAL_COMMUNITY): Payer: Self-pay | Admitting: Psychiatry

## 2021-08-16 ENCOUNTER — Other Ambulatory Visit: Payer: Self-pay

## 2021-08-16 ENCOUNTER — Ambulatory Visit (INDEPENDENT_AMBULATORY_CARE_PROVIDER_SITE_OTHER): Payer: Medicaid Other | Admitting: Psychiatry

## 2021-08-16 DIAGNOSIS — F32A Depression, unspecified: Secondary | ICD-10-CM

## 2021-08-16 DIAGNOSIS — F411 Generalized anxiety disorder: Secondary | ICD-10-CM

## 2021-08-16 MED ORDER — MIRTAZAPINE 15 MG PO TABS: 15.0000 mg | ORAL_TABLET | Freq: Every day | ORAL | 3 refills | Status: AC

## 2021-08-16 MED ORDER — HYDROXYZINE HCL 10 MG PO TABS: 10.0000 mg | ORAL_TABLET | Freq: Three times a day (TID) | ORAL | 3 refills | Status: AC | PRN

## 2021-08-16 NOTE — Progress Notes (Addendum)
Psychiatric Initial Adult Assessment  Virtual Visit via Telephone Note  I connected with Miguel Byrd on 08/16/21 at  8:00 AM EST by telephone and verified that I am speaking with the correct person using two identifiers.  Location: Patient: Clinic Provider: Home office   I discussed the limitations, risks, security and privacy concerns of performing an evaluation and management service by telephone and the availability of in person appointments. I also discussed with the patient that there may be a patient responsible charge related to this service. The patient expressed understanding and agreed to proceed.   I provided 30 minutes of non-face-to-face time during this encounter.  Patient Identification: Miguel Byrd MRN:  [id number] Date of Evaluation:  08/16/2021 Referral Source: Walk in  Chief Complaint:   Visit Diagnosis:    ICD-10-CM   1. Mild episode of depression  F32.A mirtazapine (REMERON) 15 MG tablet    hydrOXYzine (ATARAX) 10 MG tablet    Ambulatory referral to Social Work    2. Generalized anxiety disorder  F41.1 mirtazapine (REMERON) 15 MG tablet    Ambulatory referral to Social Work      History of Present Illness:  20 year old male seen today for initial psychiatric evaluation. He was recently seen at [facility] on 08/11/2021 where he presented with SI with plan to slit his wrist. He has a psychiatric history or depression and SI. Currently he is not managed on medications.  Today he is well groomed, pleasant, cooperative, and engaged in conversation. He notes that since being seen at [facility] he has been feeling much better. He reports that he felt lost and is now trying to figure out future plans. Patient notes that his main worry is his future endeavors. He notes that he once thought that he would have a career in music but has now lost interest. Recently he notes that he lost his job and is in search for another. Today provider conducted a GAD 7 and patient scored and 11.  Provider also conducted a PHQ 9 and patient scored a 14. He reports that his sleep and appetite is poor. Patient notes that he sleeps 4-6 hours and forces himself to eat. Patient was seen was his mother who notes that he appears more down, has a poor appetite, and is slimmer. Patient notes he smokes marijuana to increase his appetite. Provider informed patient that marijuana can exacerbate his anxiety and depression and encouraged patient to reduce or  discontinue his consumption. He endorsed understanding and agreed.   Today he is agreeable to starting Mirtazapine 15 mg nightly to help manage sleep, anxiety, and depression. He will also start Hydroxyzine 10 mg three times daily to help manage anxiety. Potential side effects of medication and risks vs benefits of treatment vs non-treatment were explained and discussed. All questions were answered. Patient referred to outpatient counseling for therapy. No other concerns noted at this time.   Associated Signs/Symptoms: Depression Symptoms:  depressed mood, anhedonia, insomnia, psychomotor agitation, fatigue, feelings of worthlessness/guilt, difficulty concentrating, suicidal thoughts without plan, anxiety, decreased appetite, (Hypo) Manic Symptoms:  Distractibility, Irritable Mood, Anxiety Symptoms:  Excessive Worry, Panic Symptoms, Psychotic Symptoms:   Denies PTSD Symptoms: NA  Past Psychiatric History: SI and Depression  Previous Psychotropic Medications: No   Substance Abuse History in the last 12 months:  Yes.    Consequences of Substance Abuse: NA  Past Medical History:  Past Medical History:  Diagnosis Date   Asthma    History reviewed. No pertinent surgical history.  Family Psychiatric History: Denies  Family History:  Family History  Problem Relation Age of Onset   Asthma Other     Social History:   Social History   Socioeconomic History   Marital status: Single    Spouse name: Not on file   Number of  children: Not on file   Years of education: Not on file   Highest education level: Not on file  Occupational History   Not on file  Tobacco Use   Smoking status: Never   Smokeless tobacco: Never  Vaping Use   Vaping Use: Never used  Substance and Sexual Activity   Alcohol use: No   Drug use: No   Sexual activity: Never  Other Topics Concern   Not on file  Social History Narrative   Not on file   Social Determinants of Health   Financial Resource Strain: Not on file  Food Insecurity: Not on file  Transportation Needs: Not on file  Physical Activity: Not on file  Stress: Not on file  Social Connections: Not on file    Additional Social History: Patient resides in [place] with his cousin. He is single and has no children. He reports that he smokes marijuana twice weekly. He denies tobacco and alcohol use. Currently he is unemployed.    Allergies:  No Known Allergies  Metabolic Disorder Labs: No results found for: HGBA1C, MPG No results found for: PROLACTIN No results found for: CHOL, TRIG, HDL, CHOLHDL, VLDL, LDLCALC No results found for: TSH  Therapeutic Level Labs: No results found for: LITHIUM No results found for: CBMZ No results found for: VALPROATE  Current Medications: Current Outpatient Medications  Medication Sig Dispense Refill   hydrOXYzine (ATARAX) 10 MG tablet Take 1 tablet (10 mg total) by mouth 3 (three) times daily as needed. 90 tablet 3   mirtazapine (REMERON) 15 MG tablet Take 1 tablet (15 mg total) by mouth at bedtime. 30 tablet 3   albuterol (VENTOLIN HFA) 108 (90 Base) MCG/ACT inhaler Inhale 1-2 puffs into the lungs every 6 (six) hours as needed for wheezing or shortness of breath. (Patient not taking: Reported on 08/12/2021) 18 g 0   No current facility-administered medications for this visit.    Musculoskeletal: Strength & Muscle Tone:  Unable to assess due to telephone  Gait & Station:  Unable to assess due to telephone  Patient leans:  N/A  Psychiatric Specialty Exam: Review of Systems  There were no vitals taken for this visit.There is no height or weight on file to calculate BMI.  General Appearance:  Unable to assess due to telephone   Eye Contact:   Unable to assess due to telephone   Speech:  NA  Volume:  Normal  Mood:  Anxious and Depressed  Affect:  Appropriate and Congruent  Thought Process:  Coherent, Goal Directed, and Linear  Orientation:  Full (Time, Place, and Person)  Thought Content:  WDL and Logical  Suicidal Thoughts:  Yes.  without intent/plan  Homicidal Thoughts:  No  Memory:  Immediate;   Good Recent;   Good Remote;   Good  Judgement:  Good  Insight:  Good  Psychomotor Activity:  Normal  Concentration:  Concentration: Good and Attention Span: Good  Recall:  Good  Fund of Knowledge:Good  Language: Good  Akathisia:   Unable to assess due to telephone visit  Handed:  Left  AIMS (if indicated):  not done  Assets:  Communication Skills Desire for Improvement Financial Resources/Insurance Housing Leisure Time Physical  Health  ADL's:  Intact  Cognition: WNL  Sleep:  Poor   Screenings: GAD-7    Flowsheet Row Office Visit from 08/16/2021 in [facility]  Total GAD-7 Score 11      PHQ2-9    Flowsheet Row Office Visit from 08/16/2021 in [facility]  PHQ-2 Total Score 3 1  PHQ-9 Total Score 14 --      Flowsheet Row Office Visit from 08/16/2021 in [facility] ED from 08/11/2021 in [facility] from 12/01/2020 in [facility] RISK CATEGORY Error: Q7 should not be populated when Q6 is No High Risk No Risk       Assessment and Plan: Patient endorses symptoms of anxiety, depression, insomnia, and passive SI. Today he is agreeable to starting Mirtazapine 15 mg  nightly to help manage sleep, anxiety, and depression. He will also start Hydroxyzine 10 mg three times daily to help manage anxiety. Patient referred to outpatient counseling for therapy.  1. Mild episode of depression  Start- mirtazapine (REMERON) 15 MG tablet; Take 1 tablet (15 mg total) by mouth at bedtime.  Dispense: 30 tablet; Refill: 3 Start- hydrOXYzine (ATARAX) 10 MG tablet; Take 1 tablet (10 mg total) by mouth 3 (three) times daily as needed.  Dispense: 90 tablet; Refill: 3 - Ambulatory referral to Social Work  2. Generalized anxiety disorder  Start- mirtazapine (REMERON) 15 MG tablet; Take 1 tablet (15 mg total) by mouth at bedtime.  Dispense: 30 tablet; Refill: 3 - Ambulatory referral to Social Work  Follow up in 3 months Follow up with therapy  Shanna Cisco, NP 12/13/20228:37 AM

## 2021-08-22 ENCOUNTER — Ambulatory Visit (HOSPITAL_COMMUNITY): Payer: Medicaid Other | Admitting: Licensed Clinical Social Worker

## 2021-09-13 ENCOUNTER — Other Ambulatory Visit: Payer: Self-pay

## 2021-09-13 ENCOUNTER — Ambulatory Visit (HOSPITAL_COMMUNITY): Payer: Medicaid Other | Admitting: Licensed Clinical Social Worker

## 2021-09-13 DIAGNOSIS — F411 Generalized anxiety disorder: Secondary | ICD-10-CM

## 2021-09-13 DIAGNOSIS — F32A Depression, unspecified: Secondary | ICD-10-CM | POA: Insufficient documentation

## 2021-09-13 NOTE — Progress Notes (Signed)
Comprehensive Clinical Assessment (CCA) Note  09/13/2021 Herny Byrd [id number]  Chief Complaint:  Chief Complaint  Patient presents with   Suicidal    Passive suicidal thoughts without intent or plan.    Depression   Anxiety   Addiction Problem    Marijuana: 1 gram daily.    Visit Diagnosis: Mild depression and generalized anxiety disorder  Client is a 21 year old male . Client is referred by [facility] medication provider for a anxiety and depression.   Client states mental health symptoms as evidenced by:   Depression Difficulty Concentrating; Sleep (too much or little); Increase/decrease in appetite; Hopelessness; Worthlessness; Fatigue Difficulty Concentrating; Sleep (too much or little); Increase/decrease in appetite; Hopelessness; Worthlessness; FatigueDepression. Difficulty Concentrating; Sleep (too much or little); Increase/decrease in appetite; Hopelessness; Worthlessness; Fatigue. Last Filed Value  Duration of Depressive Symptoms Greater than two weeks Greater than two weeksDuration of Depressive Symptoms. Greater than two weeks. Last Filed Value  Mania None NoneMania. None. Last Filed Value  Anxiety Difficulty concentrating; Tension; Worrying; Irritability Difficulty concentrating; Tension; Worrying; IrritabilityAnxiety. Difficulty concentrating; Tension; Worrying; Irritability. Last Filed Value  Psychosis None NonePsychosis. None. Last Filed Value  Trauma None NoneTrauma. None. Last Filed Value  Obsessions Cause anxiety Cause anxietyObsessions. Cause anxiety. Last Filed Value  Compulsions Disrupts with routine/functioning Disrupts with routine/functioningCompulsions. Disrupts with routine/functioning. Last Filed Value  Inattention None NoneInattention. None. Last Filed Value  Hyperactivity/Impulsivity None NoneHyperactivity/Impulsivity. None. Last Filed Value  Oppositional/Defiant Behaviors None NoneOppositional/Defiant Behaviors. None. Last  Filed Value  Emotional Irregularity None None    Client denies hallucinations and delusions at this time   Client was screened for the following SDOH: Exercise, stress\tension, social interaction, depression  Assessment Information that integrates subjective and objective details with a therapist's professional interpretation:     Patient was alert and oriented x5.  He was dressed casually and engaged well in therapy session.  Miguel Byrd presented today with anxious and depressed mood\affect.  He was pleasant, cooperative, and maintained good eye contact  Patient comes in today as a referral from [place].  Patient reports passive suicidal ideations without intent or plan.  LCSW did complete suicide safety risk plan and it is charted in person centered treatment tab.  Patient currently denies any auditory or visual hallucinations.  Patient does endorse symptoms for isolation, lack of motivation, insomnia, poor concentration, worthlessness, tension, and worry.  [name] is currently being prescribed medications for Remeron 15 mg and hydroxyzine 10 mg.  Patient reports that his primary goals are to and increase his social interactions by decreasing his anxiety and depression.  Patient wants to renew his interest in hobbies such as music.  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  Patient was observed in the [facility] emergency department 1 month ago but was discharged after 24 hours.  Patient reports that his primary support system is his mother who he reports has a good relationship with.  Plan for patient moving forward is to follow-up with this LCSW in 4 weeks.   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 (IF  CLIENT HAS A S/A PROBLEM)    Clinician assisted client with scheduling the following appointments: _______. Clinician details of appointment.    Client was in agreement with treatment recommendations.  CCA Screening, Triage and Referral (STR)  Patient Reported Information How did you hear about [place]? Family/Friend (Mother brought him to [facility].)  Referral name: Burt Ek NP    What Is the Reason for Your Visit/Call Today? Comes in for therapy referral due to passive suicdal thoughts  How Long Has This Been Causing You Problems? 1-6 months  What Do You Feel Would Help You the Most Today? Treatment for Depression or other mood problem   Have You Recently Been in Any Inpatient Treatment (Hospital/Detox/Crisis Center/28-Day Program)? No    Have You Ever Received Services From [employer] Before? Yes  Who Do You See at [facility]? [facility] [place].   Have You Recently Had Any Thoughts About Hurting Yourself? Yes  Are You Planning to Commit Suicide/Harm Yourself At This time? No   Have you Recently Had Thoughts About [place]? No   Have You Used Any Alcohol or Drugs in the Past 24 Hours? Yes  What Did You Use and How Much? yesterday   Do You Currently Have a Therapist/Psychiatrist? Yes  Name of Therapist/Psychiatrist: Burt Ek NP   Have You Been Recently Discharged From Any Office Practice or Programs? No  Explanation of Discharge From Practice/Program: No data recorded    CCA Screening Triage Referral Assessment Type of Contact: Tele-Assessment  Is this Initial or Reassessment? Initial Assessment  Date Telepsych consult ordered in [facility]:  08/11/21  Time Telepsych consult ordered in [facility]:  2316   Is CPS involved or ever been involved? Never  Is APS involved or ever been  involved? Never   Patient Determined To Be At Risk for Harm To Self or Others Based on Review of Patient Reported Information or Presenting Complaint? No    Location of Assessment: [facility]   Does Patient Present under Involuntary Commitment? No  IVC Papers Initial File Date: No data recorded  [place] of Residence: [place]   Patient Currently Receiving the Following Services: Not Receiving Services   Determination of Need: Urgent (48 hours)   Options For Referral: Other: Comment (Pt to be observed overnight in ED.  No [facility] space available at [facility].)     CCA Biopsychosocial Intake/Chief Complaint:  passive suicdal ideations and social anxiety  Current Symptoms/Problems: poor apetite, isolation, lack of motivation,   Patient Reported Schizophrenia/Schizoaffective Diagnosis in Past: No   Strengths: Pt likes to sing and make music  Preferences: none reported  Abilities: none reported   Type of Services Patient Feels are Needed: therapy and medication mgnt   Initial Clinical Notes/Concerns: No data recorded  Mental Health Symptoms Depression:   Difficulty Concentrating; Sleep (too much or little); Increase/decrease in appetite; Hopelessness; Worthlessness; Fatigue   Duration of Depressive symptoms:  Greater than two weeks   Mania:   None   Anxiety:    Difficulty concentrating; Tension; Worrying; Irritability   Psychosis:   None   Duration of Psychotic symptoms: No data recorded  Trauma:   None   Obsessions:   Cause anxiety   Compulsions:   Disrupts with routine/functioning   Inattention:   None   Hyperactivity/Impulsivity:   None   Oppositional/Defiant Behaviors:   None   Emotional Irregularity:   None   Other Mood/Personality Symptoms:  No data recorded   Mental Status Exam Appearance and  self-care  Stature:   Average   Weight:   Thin   Clothing:   Casual   Grooming:   Normal   Cosmetic use:   None    Posture/gait:   Normal   Motor activity:   Not Remarkable   Sensorium  Attention:   Normal   Concentration:   Anxiety interferes   Orientation:   X5   Recall/memory:   Normal   Affect and Mood  Affect:   Anxious; Depressed   Mood:   Anxious; Depressed   Relating  Eye contact:   Normal   Facial expression:   Depressed; Anxious   Attitude toward examiner:   Cooperative   Thought and Language  Speech flow:  Clear and Coherent   Thought content:   Appropriate to Mood and Circumstances   Preoccupation:   None   Hallucinations:   None   Organization:  No data recorded  [employer] of Knowledge:   Average   Intelligence:   Average   Abstraction:   Normal   Judgement:   Poor   Reality Testing:   Realistic   Insight:   Fair   Decision Making:   Impulsive   Social Functioning  Social Maturity:   Impulsive   Social Judgement:   Normal   Stress  Stressors:   Other (Comment) (Social interactions.)   Coping Ability:   Normal   Skill Deficits:   Interpersonal   Supports:   Family; Support needed     Religion:    Leisure/Recreation: Leisure / Recreation Do You Have Hobbies?: Yes Leisure and Hobbies: music, basketball  Exercise/Diet: Exercise/Diet Do You Exercise?: Yes What Type of Exercise Do You Do?: Run/Walk How Many Times a Week Do You Exercise?: 1-3 times a week Have You Gained or Lost A Significant Amount of Weight in the Past Six Months?: No Do You Follow a Special Diet?: No Do You Have Any Trouble Sleeping?: Yes Explanation of Sleeping Difficulties: Getting 4-5 hours.   CCA Employment/Education Employment/Work Situation: Employment / Work Situation Employment Situation: Unemployed Patient's Job has Been Impacted by Current Illness: No Has Patient ever Been in Passenger transport manager?: No  Education: Education Last Grade Completed: 12 Did Teacher, adult education From [employer]?: Yes Did Physicist, medical?:  Yes What Type of College Degree Do you Have?: Attended [employer] for a semester Did You Have An Individualized Education Program (IIEP): No Did You Have Any Difficulty At School?: No Patient's Education Has Been Impacted by Current Illness: No   CCA Family/Childhood History Family and Relationship History: Family history Marital status: Single Are you sexually active?: No What is your sexual orientation?: hetrosexual Has your sexual activity been affected by drugs, alcohol, medication, or emotional stress?: none reported  Childhood History:  Childhood History By whom was/is the patient raised?: Mother Description of patient's relationship with caregiver when they were a child: average "had its ups and downs" Patient's description of current relationship with people who raised him/her: Good How were you disciplined when you got in trouble as a child/adolescent?: belt Does patient have siblings?: Yes Number of Siblings: 2 Description of patient's current relationship with siblings: good Did patient suffer any verbal/emotional/physical/sexual abuse as a child?: No Did patient suffer from severe childhood neglect?: No Has patient ever been sexually abused/assaulted/raped as an adolescent or adult?: No Was the patient ever a victim of a crime or a disaster?: No Witnessed domestic violence?: No Has patient been affected by domestic violence as an adult?: No  Child/Adolescent Assessment:     CCA Substance Use Alcohol/Drug Use: Alcohol / Drug Use Pain Medications: None Prescriptions: review MAR Over the Counter: None History of alcohol / drug use?: Yes Substance #1 Name of Substance 1: Marijuana 1 - Age of First Use: 15 1 - Amount (size/oz): 1 gram 1 - Frequency: daily 1 - Duration: 1 x daily 1 - Last Use / Amount: yesterday 1 - Method of Aquiring: dealer 1- Route of Use: inhale     DSM5 Diagnoses: Patient Active Problem List   Diagnosis Date Noted   GAD (generalized  anxiety disorder) 09/13/2021   Mild episode of depression 09/13/2021   Adjustment disorder with emotional disturbance 08/12/2021      Dory Horn, LCSW

## 2021-09-13 NOTE — Plan of Care (Signed)
Pt agreeable to plan  ?

## 2021-10-11 ENCOUNTER — Ambulatory Visit (HOSPITAL_COMMUNITY): Payer: Medicaid Other | Admitting: Licensed Clinical Social Worker

## 2021-10-12 ENCOUNTER — Other Ambulatory Visit: Payer: Self-pay

## 2021-10-12 ENCOUNTER — Ambulatory Visit (HOSPITAL_COMMUNITY): Payer: Medicaid Other | Admitting: Licensed Clinical Social Worker

## 2021-10-12 DIAGNOSIS — F411 Generalized anxiety disorder: Secondary | ICD-10-CM

## 2021-10-12 DIAGNOSIS — F32A Depression, unspecified: Secondary | ICD-10-CM

## 2021-10-12 NOTE — Progress Notes (Signed)
° °  THERAPIST PROGRESS NOTE  Session Time: 49   Participation Level: Active  Behavioral Response: CasualAlertAnxious and Depressed  Type of Therapy: Individual Therapy  Treatment Goals addressed: Anxiety and Coping  Interventions: CBT and Motivational Interviewing  Summary: Miguel Byrd is a 21 y.o. male who presents with depressed, anxious, flat mood\affect.  Patient was pleasant, cooperative, maintained good eye contact.  He engaged well in therapy session and was dressed casually.  Patient reports "everything has been going very well".  This is as evidenced by GAD-7 going down to a 2 and PHQ-9 going down to a 2.  Patient reports decreased depression and anxiety as evidenced by decreased isolation.  Patient reports that he has been going outside of his room more often to hang out with his cousin and play video games.  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  Miguel Byrd reports burnout was caused by over recording.  Miguel Byrd reports good news for employment at the [name]".  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  Suicidal/Homicidal: Nowithout intent/plan  Therapist Response:     Intervention/Plan: Interventions utilized in today's session were motivational interviewing for positive affirmations, open-ended questions, and reflective listening.  LCSW supportive therapy for praise and encouragement.  LCSW used person centered therapy for empowerment.  Plan for patient is to maintain at least 20 hours of employment.  Maintaining GAD-7 and PHQ-9 below 5 for more than 2 months.  Patient to attempt 1 activity outside of video games and work at least 2 times per week.  Plan: Return again in 4 weeks.      Weber Cooks, LCSW 10/12/2021

## 2021-11-01 ENCOUNTER — Telehealth (HOSPITAL_COMMUNITY): Payer: Medicaid Other | Admitting: Psychiatry

## 2021-11-01 ENCOUNTER — Encounter (HOSPITAL_COMMUNITY): Payer: Self-pay

## 2021-11-02 ENCOUNTER — Encounter (HOSPITAL_COMMUNITY): Payer: Self-pay

## 2021-11-02 ENCOUNTER — Ambulatory Visit (HOSPITAL_COMMUNITY): Payer: Medicaid Other | Admitting: Licensed Clinical Social Worker

## 2021-11-29 ENCOUNTER — Ambulatory Visit (HOSPITAL_COMMUNITY): Payer: Medicaid Other | Admitting: Licensed Clinical Social Worker

## 2021-12-25 ENCOUNTER — Encounter (HOSPITAL_COMMUNITY): Payer: Self-pay

## 2021-12-25 ENCOUNTER — Ambulatory Visit (HOSPITAL_COMMUNITY)
Admission: EM | Admit: 2021-12-25 | Discharge: 2021-12-25 | Disposition: A | Discharge status: Home / Self Care | Payer: Medicaid Other | Attending: Nurse Practitioner | Admitting: Nurse Practitioner

## 2021-12-25 DIAGNOSIS — M79662 Pain in left lower leg: Secondary | ICD-10-CM

## 2021-12-25 MED ORDER — TIZANIDINE HCL 4 MG PO TABS: 4.0000 mg | ORAL_TABLET | Freq: Every evening | ORAL | 0 refills | Status: AC | PRN

## 2021-12-25 NOTE — ED Provider Notes (Signed)
?[place] ? ? ? ?CSN: [id number] ?Arrival date & time: 12/25/21  1023 ? ? ?  ? ?History   ?Chief Complaint ?No chief complaint on file. ? ? ?HPI ?Miguel Byrd is a 21 y.o. male.  ? ?Patient presents with pain today in his left calf that started last night while he was sleeping.  He reports the pain was a severe tightness/spasm pain, worse than a [employer].  He reports after a little while, the pain got better.  Currently, he can still feel the pain, however it is much diminished.  He did not take any Tylenol, Motrin, ibuprofen or try anything else for the pain.  He denies any decreased range of motion, trouble walking, problems with swelling, redness, or warmth.  Denies any unintentional leg jerking, numbness or tingling in his toes, decreased sensation, weakness.  Denies any skin color changes. ? ? ? ?Past Medical History:  ?Diagnosis Date  ? Asthma   ? ? ?Patient Active Problem List  ? Diagnosis Date Noted  ? GAD (generalized anxiety disorder) 09/13/2021  ? Mild episode of depression 09/13/2021  ? Adjustment disorder with emotional disturbance 08/12/2021  ? ? ?History reviewed. No pertinent surgical history. ? ? ? ? ?Home Medications   ? ?Prior to Admission medications   ?Medication Sig Start Date End Date Taking? Authorizing Provider  ?albuterol (VENTOLIN HFA) 108 (90 Base) MCG/ACT inhaler Inhale 1-2 puffs into the lungs every 6 (six) hours as needed for wheezing or shortness of breath. ?Patient not taking: Reported on 08/12/2021 12/01/20   Hughie Closs, PA-C  ?hydrOXYzine (ATARAX) 10 MG tablet Take 1 tablet (10 mg total) by mouth 3 (three) times daily as needed. 08/16/21   Salley Slaughter, NP  ?mirtazapine (REMERON) 15 MG tablet Take 1 tablet (15 mg total) by mouth at bedtime. 08/16/21   Salley Slaughter, NP  ? ? ?Family History ?Family History  ?Problem Relation Age of Onset  ? Asthma Other   ? ? ?Social History ?Social History  ? ?Tobacco Use  ? Smoking status: Never  ? Smokeless  tobacco: Never  ?Vaping Use  ? Vaping Use: Never used  ?Substance Use Topics  ? Alcohol use: No  ? Drug use: No  ? ? ? ?Allergies   ?Patient has no known allergies. ? ? ?Review of Systems ?Review of Systems ?Per HPI ? ?Physical Exam ?Triage Vital Signs ?ED Triage Vitals  ?Enc Vitals Group  ?   BP 12/25/21 1113 105/68  ?   Pulse Rate 12/25/21 1113 (!) 58  ?   Resp 12/25/21 1113 16  ?   Temp 12/25/21 1113 97.8 ?F (36.6 ?C)  ?   Temp Source 12/25/21 1113 Oral  ?   SpO2 12/25/21 1113 96 %  ?   Weight --   ?   Height --   ?   Head Circumference --   ?   Peak Flow --   ?   Pain Score 12/25/21 1114 3  ?   Pain Loc --   ?   Pain Edu? --   ?   Excl. in [place]? --   ? ?No data found. ? ?Updated Vital Signs ?BP 105/68 (BP Location: Left Arm)   Pulse (!) 58   Temp 97.8 ?F (36.6 ?C) (Oral)   Resp 16   SpO2 96%  ? ?Visual Acuity ?Right Eye Distance:   ?Left Eye Distance:   ?Bilateral Distance:   ? ?Right Eye Near:   ?Left Eye  Near:    ?Bilateral Near:    ? ?Physical Exam ?Vitals and nursing note reviewed.  ?Constitutional:   ?   General: He is not in acute distress. ?   Appearance: Normal appearance. He is not ill-appearing or toxic-appearing.  ?Musculoskeletal:     ?   General: No tenderness.  ?   Right lower leg: No tenderness or bony tenderness. No edema.  ?   Left lower leg: No tenderness or bony tenderness. No edema.  ?   Right ankle: Normal. No swelling. Normal range of motion. Normal pulse.  ?   Right Achilles Tendon: Normal. No tenderness. Thompson's test negative.  ?   Left ankle: Normal. No swelling. Normal range of motion. Normal pulse.  ?   Left Achilles Tendon: Normal. No tenderness. Thompson's test negative.  ?   Right foot: Normal range of motion and normal capillary refill. Normal pulse.  ?   Left foot: Normal range of motion and normal capillary refill. Normal pulse.  ?   Comments: No erythema, swelling or warmth to calves bilaterally.  They are symmetric.  Negative Homan's sign left leg  ?Skin: ?   General: Skin  is warm and dry.  ?   Capillary Refill: Capillary refill takes less than 2 seconds.  ?   Coloration: Skin is not jaundiced or pale.  ?   Findings: No bruising, ecchymosis, erythema, petechiae, rash or wound.  ?Neurological:  ?   Mental Status: He is alert and oriented to person, place, and time.  ?Psychiatric:     ?   Behavior: Behavior is cooperative.  ? ? ? ?UC Treatments / Results  ?Labs ?(all labs ordered are listed, but only abnormal results are displayed) ?Labs Reviewed - No data to display ? ?EKG ? ? ?Radiology ?No results found. ? ?Procedures ?Procedures (including critical care time) ? ?Medications Ordered in UC ?Medications - No data to display ? ?Initial Impression / Assessment and Plan / UC Course  ?I have reviewed the triage vital signs and the nursing notes. ? ?Pertinent labs & imaging results that were available during my care of the patient were reviewed by me and considered in my medical decision making (see chart for details). ? ?  ?Very pleasant 21 year old male presenting with pain in his left calf.  Low suspicion for DVT given history and examination today is reassuring; there is no swelling, warmth, redness, or pain with dorsiflexion of left foot.   Suspect muscle cramping.  Encouraged ice, Tylenol/ibuprofen for pain, and light stretches.  Encouraged plenty of hydration with water.  If pain severe before bed tonight, can use muscle relaxant.  Will give note for work today.  Seek care if symptoms worsen or persist despite treatment. ?Final Clinical Impressions(s) / UC Diagnoses  ? ?Final diagnoses:  ?Pain of left calf  ? ? ? ?Discharge Instructions   ? ?  ?- Alternate Tylenol and ibuprofen to help with the muscle pain that is in your calf ?-You can also use to cool compresses/ice packs to help and stretching ?-If this is not helpful, you can use tizanidine 4 mg at nighttime as needed to help with muscle pain ?- Please seek care if your symptoms persist or worsen despite treatment ? ? ? ? ?ED  Prescriptions   ?None ?  ? ?PDMP not reviewed this encounter. ?  ?Eulogio Bear, NP ?12/25/21 1220 ? ?

## 2021-12-25 NOTE — Discharge Instructions (Addendum)
-   Alternate Tylenol and ibuprofen to help with the muscle pain that is in your calf ?-You can also use to cool compresses/ice packs to help and stretching ?-If this is not helpful, you can use tizanidine 4 mg at nighttime as needed to help with muscle pain ?- Please seek care if your symptoms persist or worsen despite treatment ?

## 2021-12-25 NOTE — ED Triage Notes (Signed)
C/o left leg pain after a sharp pain last night.  ?

## 2021-12-28 ENCOUNTER — Other Ambulatory Visit (HOSPITAL_COMMUNITY): Payer: Self-pay | Admitting: Psychiatry

## 2021-12-28 DIAGNOSIS — F411 Generalized anxiety disorder: Secondary | ICD-10-CM

## 2021-12-28 DIAGNOSIS — F32A Depression, unspecified: Secondary | ICD-10-CM

## 2022-02-21 ENCOUNTER — Other Ambulatory Visit: Payer: Self-pay | Admitting: Nurse Practitioner

## 2022-02-21 NOTE — Telephone Encounter (Signed)
Requested medication (s) are due for refill today: yes  Requested medication (s) are on the active medication list: yes  Last refill:  12/25/21 #30  Future visit scheduled: no  Notes to clinic:  [facility] RN not delegated to refuse or refill med routing to prescriber   Requested Prescriptions  Pending Prescriptions Disp Refills   tiZANidine (ZANAFLEX) 4 MG tablet [Pharmacy Med Name: TIZANIDINE 4MG  TABLETS] 30 tablet 0    Sig: TAKE 1 TABLET(4 MG) BY MOUTH AT BEDTIME AS NEEDED FOR MUSCLE SPASMS. 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

## 2022-07-14 IMAGING — DX DG CHEST 2V
2 series · 2 of 2 positions shown · non-contrast
Comparison: 07/20/2016

CLINICAL DATA: Shortness of breath, hiatal hernia, trouble
breathing and chest pain last night, cough this morning; history
asthma

EXAM:
CHEST - 2 VIEW

[chest pa]
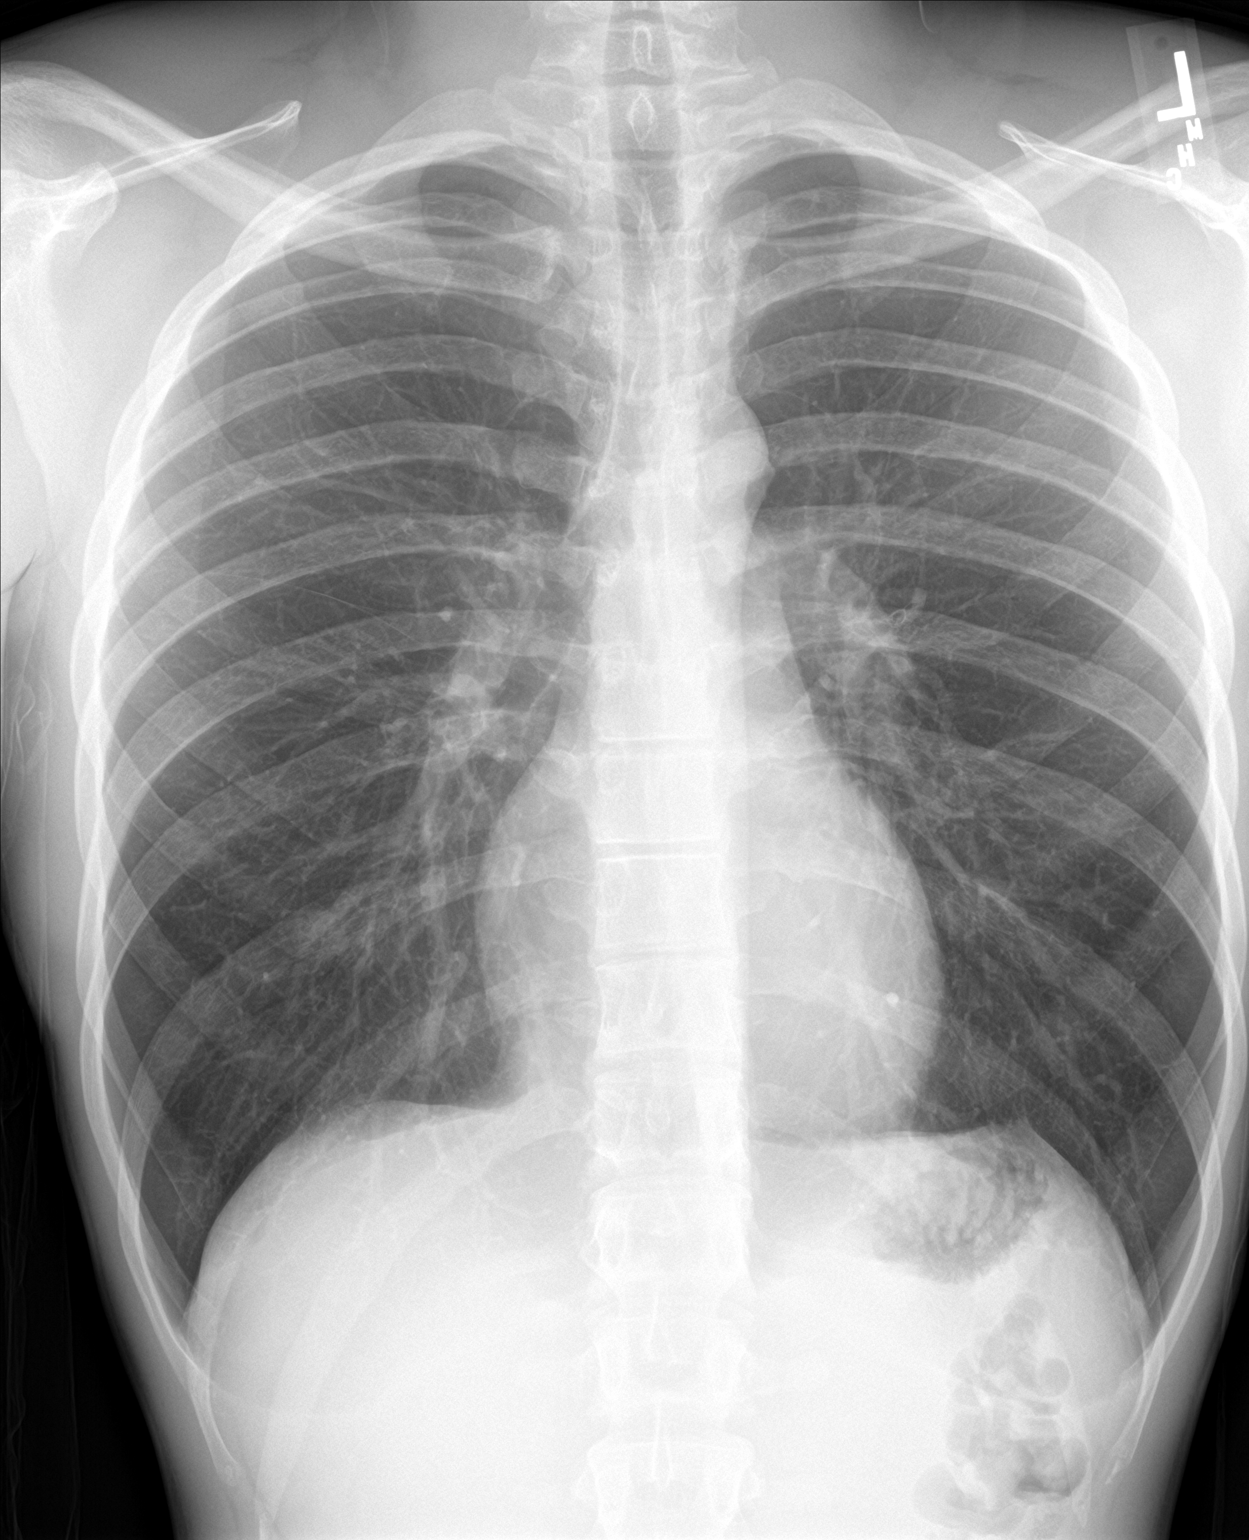

[chest lat]
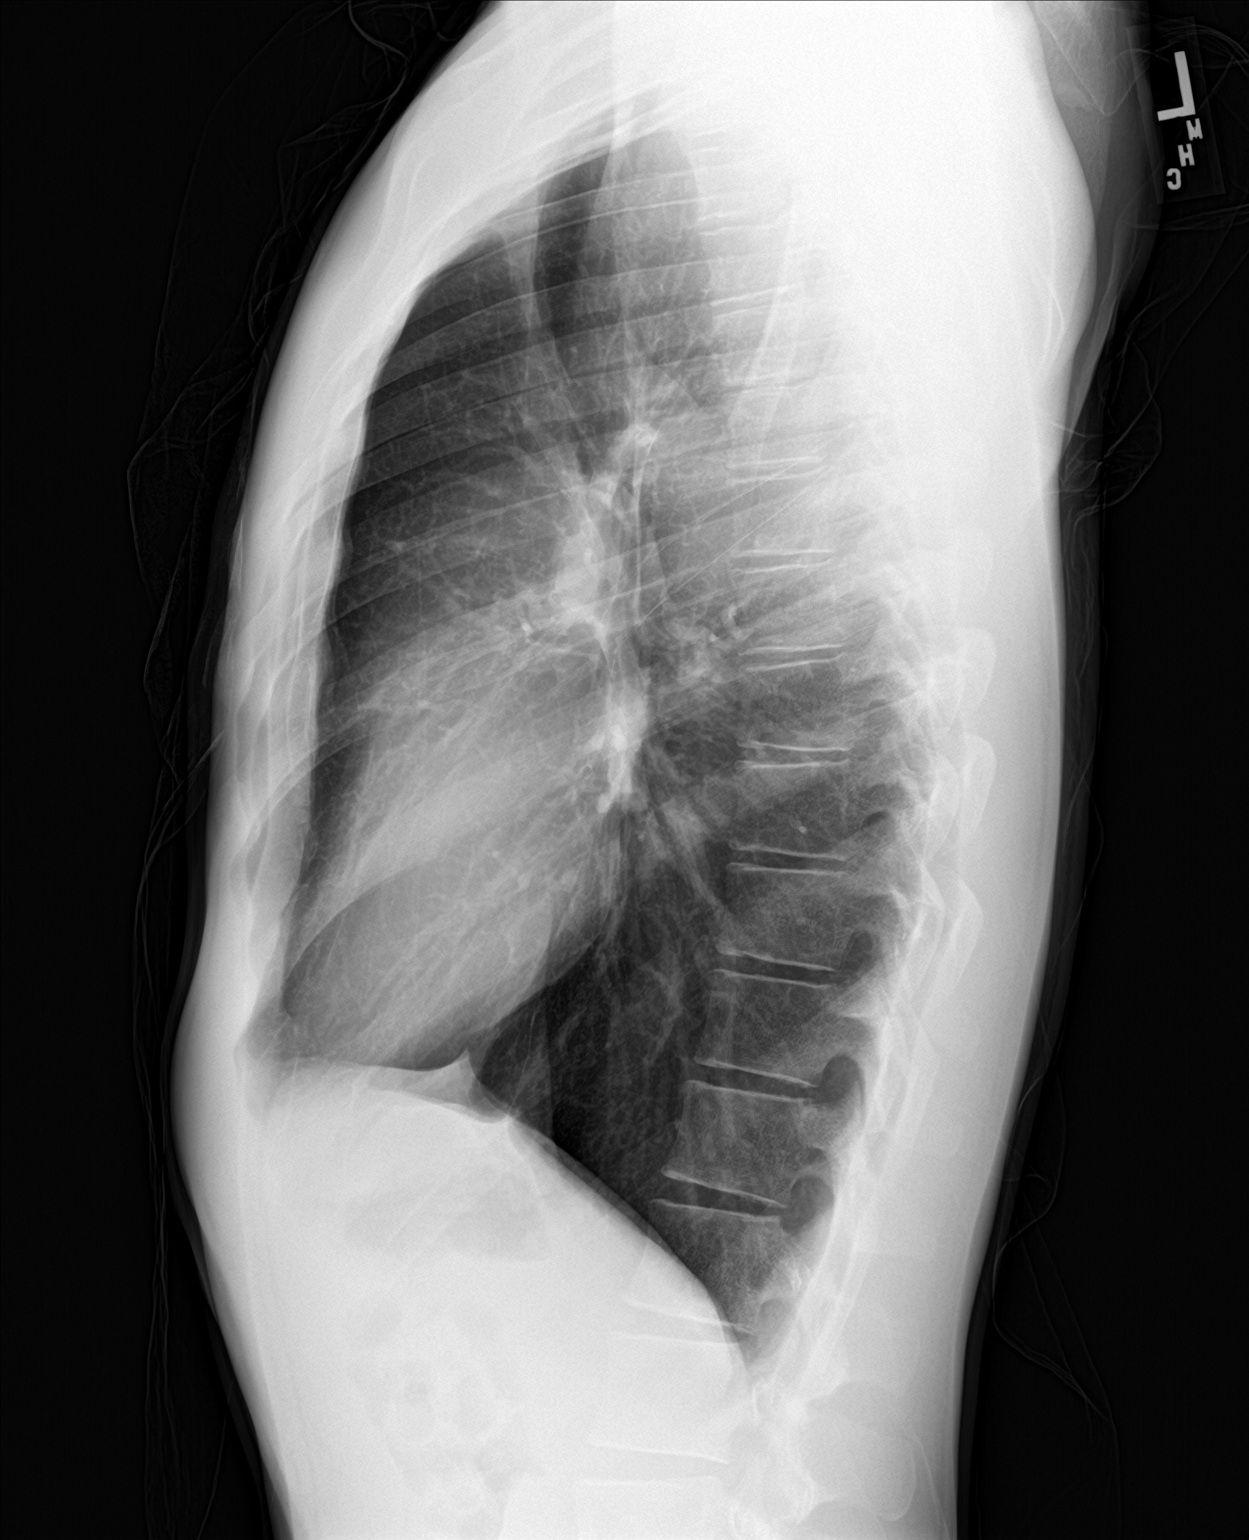

[2 of 2 positions shown; findings below may reference images not displayed]

FINDINGS: Normal heart size, mediastinal contours, and pulmonary vascularity.

Lungs mildly hyperinflated but clear.

No infiltrate, pleural effusion, or pneumothorax.

Osseous structures unremarkable.
IMPRESSION: Hyperinflated lungs consistent with history of asthma.

Otherwise negative exam.

## 2023-04-23 ENCOUNTER — Ambulatory Visit (HOSPITAL_COMMUNITY)
Admission: EM | Admit: 2023-04-23 | Discharge: 2023-04-23 | Disposition: A | Discharge status: Home / Self Care | Payer: Medicaid Other | Attending: Internal Medicine | Admitting: Internal Medicine

## 2023-04-23 ENCOUNTER — Other Ambulatory Visit: Payer: Self-pay

## 2023-04-23 ENCOUNTER — Encounter (HOSPITAL_COMMUNITY): Payer: Self-pay | Admitting: *Deleted

## 2023-04-23 DIAGNOSIS — L219 Seborrheic dermatitis, unspecified: Secondary | ICD-10-CM | POA: Diagnosis not present

## 2023-04-23 MED ORDER — KETOCONAZOLE 2 % EX SHAM
1.0000 | MEDICATED_SHAMPOO | CUTANEOUS | 2 refills | Status: AC
Start: 2023-04-23 — End: ?

## 2023-04-23 MED ORDER — CLOBETASOL PROPIONATE 0.05 % EX SOLN: 1.0000 | Freq: Two times a day (BID) | CUTANEOUS | 2 refills | Status: AC

## 2023-04-23 NOTE — Discharge Instructions (Signed)
Start clobetasol solution to scalp twice daily in a thin layer until dry/irritation resolves. Use ketoconazole shampoo 1-2 times weekly as needed. Follow up if symptoms worsening or fail to improve. May want to consider dermatology if significantly worsens or evidence of any hair loss.

## 2023-04-23 NOTE — ED Provider Notes (Signed)
[facility]    CSN: [id number] Arrival date & time: 04/23/23  1244      History   Chief Complaint Chief Complaint  Patient presents with   dry patches in hair    HPI Miguel Byrd is a 22 y.o. male.   22 year old male who presents to urgent care with a several week history of worsening flaking and dry skin of the scalp.  This is associated with itching as well.  He has no history of this in the past.  He has been using [place] oil but this has not helped.  He denies any fevers, chills, swelling, pain.  He washes his hair 2 times weekly.     Past Medical History:  Diagnosis Date   Asthma     Patient Active Problem List   Diagnosis Date Noted   GAD (generalized anxiety disorder) 09/13/2021   Mild episode of depression 09/13/2021   Adjustment disorder with emotional disturbance 08/12/2021    History reviewed. No pertinent surgical history.     Home Medications    Prior to Admission medications   Medication Sig Start Date End Date Taking? Authorizing Provider  clobetasol (TEMOVATE) 0.05 % external solution Apply 1 Application topically 2 (two) times daily. Until scalp irritation resolves. 04/23/23  Yes Dalilah Curlin A, PA-C  ketoconazole (NIZORAL) 2 % shampoo Apply 1 Application topically 2 (two) times a week. Apply to scalp and then let sit for 4-5 minutes before rinsing. 04/23/23  Yes Maxim Bedel A, PA-C  albuterol (VENTOLIN HFA) 108 (90 Base) MCG/ACT inhaler Inhale 1-2 puffs into the lungs every 6 (six) hours as needed for wheezing or shortness of breath. Patient not taking: Reported on 08/12/2021 12/01/20   Rushie Chestnut, PA-C  hydrOXYzine (ATARAX) 10 MG tablet Take 1 tablet (10 mg total) by mouth 3 (three) times daily as needed. 08/16/21   Shanna Cisco, NP  mirtazapine (REMERON) 15 MG tablet Take 1 tablet (15 mg total) by mouth at bedtime. 08/16/21   Shanna Cisco, NP  tiZANidine (ZANAFLEX) 4 MG tablet Take 1 tablet (4 mg total) by  mouth at bedtime as needed for muscle spasms. Do not take while driving or operating heavy machinery 12/25/21   Valentino Nose, NP    Family History Family History  Problem Relation Age of Onset   Asthma Other     Social History Social History   Tobacco Use   Smoking status: Never   Smokeless tobacco: Never  Vaping Use   Vaping status: Never Used  Substance Use Topics   Alcohol use: No   Drug use: No     Allergies   Patient has no known allergies.   Review of Systems Review of Systems  Constitutional:  Negative for chills and fever.  HENT:  Negative for ear pain and sore throat.   Eyes:  Negative for pain and visual disturbance.  Respiratory:  Negative for cough and shortness of breath.   Cardiovascular:  Negative for chest pain and palpitations.  Gastrointestinal:  Negative for abdominal pain and vomiting.  Genitourinary:  Negative for dysuria and hematuria.  Musculoskeletal:  Negative for arthralgias and back pain.  Skin:  Positive for rash. Negative for color change.       Scalp itching and dryness with flaking  Neurological:  Negative for seizures and syncope.  All other systems reviewed and are negative.    Physical Exam Triage Vital Signs ED Triage Vitals  Encounter Vitals Group     BP  04/23/23 1428 116/81     Systolic BP Percentile --      Diastolic BP Percentile --      Pulse Rate 04/23/23 1428 77     Resp 04/23/23 1428 16     Temp 04/23/23 1428 98.2 F (36.8 C)     Temp src --      SpO2 04/23/23 1428 99 %     Weight --      Height --      Head Circumference --      Peak Flow --      Pain Score 04/23/23 1426 0     Pain Loc --      Pain Education --      Exclude from Growth Chart --    No data found.  Updated Vital Signs BP 116/81   Pulse 77   Temp 98.2 F (36.8 C)   Resp 16   SpO2 99%   Visual Acuity Right Eye Distance:   Left Eye Distance:   Bilateral Distance:    Right Eye Near:   Left Eye Near:    Bilateral Near:      Physical Exam Vitals and nursing note reviewed.  Constitutional:      General: He is not in acute distress.    Appearance: He is well-developed.  HENT:     Head: Normocephalic and atraumatic.  Eyes:     Conjunctiva/sclera: Conjunctivae normal.  Cardiovascular:     Rate and Rhythm: Normal rate and regular rhythm.     Heart sounds: No murmur heard. Pulmonary:     Effort: Pulmonary effort is normal. No respiratory distress.     Breath sounds: Normal breath sounds.  Abdominal:     Palpations: Abdomen is soft.     Tenderness: There is no abdominal tenderness.  Musculoskeletal:        General: No swelling.     Cervical back: Neck supple.  Skin:    General: Skin is warm and dry.     Capillary Refill: Capillary refill takes less than 2 seconds.       Neurological:     Mental Status: He is alert.  Psychiatric:        Mood and Affect: Mood normal.      UC Treatments / Results  Labs (all labs ordered are listed, but only abnormal results are displayed) Labs Reviewed - No data to display  EKG   Radiology No results found.  Procedures Procedures (including critical care time)  Medications Ordered in UC Medications - No data to display  Initial Impression / Assessment and Plan / UC Course  I have reviewed the triage vital signs and the nursing notes.  Pertinent labs & imaging results that were available during my care of the patient were reviewed by me and considered in my medical decision making (see chart for details).     Seborrheic dermatitis: Patient's presentation is most consistent with seborrheic dermatitis.  Will start him on clobetasol external solution twice daily to the dry areas of his scalp until irritation resolves.  We will also start him on ketoconazole shampoo 1 application twice weekly, allow this to sit to 4 to 5 minutes before rinsing.  He may use this once weekly once his symptoms resolved to help prevent recurrence. Final Clinical Impressions(s)  / UC Diagnoses   Final diagnoses:  Seborrheic dermatitis of scalp     Discharge Instructions      Start clobetasol solution to scalp twice daily in a  thin layer until dry/irritation resolves. Use ketoconazole shampoo 1-2 times weekly as needed. Follow up if symptoms worsening or fail to improve. May want to consider dermatology if significantly worsens or evidence of any hair loss.    ED Prescriptions     Medication Sig Dispense Auth. Provider   clobetasol (TEMOVATE) 0.05 % external solution Apply 1 Application topically 2 (two) times daily. Until scalp irritation resolves. 50 mL Karmin Kasprzak A, PA-C   ketoconazole (NIZORAL) 2 % shampoo Apply 1 Application topically 2 (two) times a week. Apply to scalp and then let sit for 4-5 minutes before rinsing. 120 mL Landis Martins, [place]      PDMP not reviewed this encounter.   Landis Martins, [place] 04/23/23 1444

## 2023-04-23 NOTE — ED Triage Notes (Signed)
PT reports having dry patches in his hair . He was used oil in his hair but still has dry patches.
